# Patient Record
Sex: Female | Born: 1935 | Marital: Married
Health system: Southern US, Community
[De-identification: ages and names within clinical notes are randomized; demographics above are authoritative.]

---

## 2009-03-20 ENCOUNTER — Emergency Department: Payer: Self-pay | Admitting: Emergency Medicine

## 2011-03-25 ENCOUNTER — Ambulatory Visit: Payer: Self-pay

## 2011-09-18 ENCOUNTER — Ambulatory Visit: Payer: Self-pay

## 2011-10-13 ENCOUNTER — Ambulatory Visit: Payer: Self-pay | Admitting: Physician Assistant

## 2013-03-24 ENCOUNTER — Ambulatory Visit: Payer: Self-pay | Admitting: Ophthalmology

## 2013-03-24 LAB — CREATININE, SERUM
Creatinine: 0.95 mg/dL (ref 0.60–1.30)
EGFR (Non-African Amer.): 55 — ABNORMAL LOW

## 2013-08-14 ENCOUNTER — Ambulatory Visit: Payer: Self-pay | Admitting: Internal Medicine

## 2013-08-28 ENCOUNTER — Inpatient Hospital Stay: Payer: Self-pay | Admitting: Internal Medicine

## 2013-08-28 LAB — TROPONIN I: Troponin-I: <0.02 ng/mL

## 2013-08-28 LAB — COMPREHENSIVE METABOLIC PANEL
Alkaline Phosphatase: 134 U/L (ref 50–136)
Anion Gap: 7 (ref 7–16)
BUN: 16 mg/dL (ref 7–18)
Bilirubin,Total: 0.4 mg/dL (ref 0.2–1.0)
Creatinine: 0.59 mg/dL — ABNORMAL LOW (ref 0.60–1.30)
EGFR (African American): >60
EGFR (Non-African Amer.): >60
Potassium: 3.8 mmol/L (ref 3.5–5.1)
SGPT (ALT): 15 U/L (ref 12–78)
Sodium: 133 mmol/L — ABNORMAL LOW (ref 136–145)

## 2013-08-28 LAB — CBC WITH DIFFERENTIAL/PLATELET
Eosinophil #: 0.1 10*3/uL (ref 0.0–0.7)
Eosinophil %: 2 %
Lymphocyte #: 1.6 10*3/uL (ref 1.0–3.6)
Lymphocyte %: 20.9 %
MCHC: 33.6 g/dL (ref 32.0–36.0)
MCV: 80 fL (ref 80–100)
Monocyte #: 0.9 x10 3/mm (ref 0.2–0.9)
Monocyte %: 11.4 %
Neutrophil #: 4.9 10*3/uL (ref 1.4–6.5)
Platelet: 345 10*3/uL (ref 150–440)
RDW: 16.8 % — ABNORMAL HIGH (ref 11.5–14.5)
WBC: 7.5 10*3/uL (ref 3.6–11.0)

## 2013-08-28 LAB — URINALYSIS, COMPLETE
Bacteria: NONE SEEN
Blood: NEGATIVE
Nitrite: NEGATIVE
Ph: 6 (ref 4.5–8.0)
RBC,UR: 7 /HPF (ref 0–5)

## 2013-08-29 DIAGNOSIS — I359 Nonrheumatic aortic valve disorder, unspecified: Secondary | ICD-10-CM

## 2013-08-29 LAB — CBC WITH DIFFERENTIAL/PLATELET
Basophil #: 0.1 10*3/uL (ref 0.0–0.1)
Eosinophil #: 0.1 10*3/uL (ref 0.0–0.7)
Eosinophil %: 0.8 %
HCT: 37.1 % (ref 35.0–47.0)
HGB: 12.5 g/dL (ref 12.0–16.0)
Lymphocyte #: 1 10*3/uL (ref 1.0–3.6)
Lymphocyte %: 13.1 %
MCH: 26.7 pg (ref 26.0–34.0)
MCHC: 33.6 g/dL (ref 32.0–36.0)
Monocyte %: 12.1 %
Neutrophil #: 5.5 10*3/uL (ref 1.4–6.5)
Neutrophil %: 73.2 %
Platelet: 319 10*3/uL (ref 150–440)
RBC: 4.68 10*6/uL (ref 3.80–5.20)
RDW: 16.6 % — ABNORMAL HIGH (ref 11.5–14.5)

## 2013-08-29 LAB — BASIC METABOLIC PANEL
Anion Gap: 9 (ref 7–16)
BUN: 17 mg/dL (ref 7–18)
Calcium, Total: 8.7 mg/dL (ref 8.5–10.1)
Chloride: 103 mmol/L (ref 98–107)
Creatinine: 0.57 mg/dL — ABNORMAL LOW (ref 0.60–1.30)
EGFR (African American): >60
EGFR (Non-African Amer.): >60
Osmolality: 268 (ref 275–301)
Potassium: 3.7 mmol/L (ref 3.5–5.1)

## 2013-08-30 LAB — LIPID PANEL
HDL Cholesterol: 40 mg/dL (ref 40–60)
Triglycerides: 112 mg/dL (ref 0–200)

## 2013-09-01 LAB — URINALYSIS, COMPLETE
Blood: NEGATIVE
Leukocyte Esterase: NEGATIVE
Nitrite: NEGATIVE
Ph: 5 (ref 4.5–8.0)
RBC,UR: 7 /HPF (ref 0–5)
Specific Gravity: 1.016 (ref 1.003–1.030)
Squamous Epithelial: <1
WBC UR: 4 /HPF (ref 0–5)

## 2013-09-01 LAB — CBC WITH DIFFERENTIAL/PLATELET
Basophil #: 0.1 10*3/uL (ref 0.0–0.1)
Eosinophil #: 0 10*3/uL (ref 0.0–0.7)
Lymphocyte %: 1.4 %
MCH: 26.8 pg (ref 26.0–34.0)
MCHC: 33.3 g/dL (ref 32.0–36.0)
MCV: 80 fL (ref 80–100)
Monocyte #: 1.4 x10 3/mm — ABNORMAL HIGH (ref 0.2–0.9)
Monocyte %: 4.7 %
Neutrophil %: 93.7 %
Platelet: 375 10*3/uL (ref 150–440)
WBC: 30.3 10*3/uL — ABNORMAL HIGH (ref 3.6–11.0)

## 2013-09-01 LAB — BASIC METABOLIC PANEL
BUN: 44 mg/dL — ABNORMAL HIGH (ref 7–18)
Co2: 18 mmol/L — ABNORMAL LOW (ref 21–32)
Creatinine: 1.64 mg/dL — ABNORMAL HIGH (ref 0.60–1.30)
EGFR (African American): 33 — ABNORMAL LOW
EGFR (Non-African Amer.): 28 — ABNORMAL LOW
Glucose: 180 mg/dL — ABNORMAL HIGH (ref 65–99)
Osmolality: 275 (ref 275–301)
Potassium: 4.2 mmol/L (ref 3.5–5.1)
Sodium: 129 mmol/L — ABNORMAL LOW (ref 136–145)

## 2013-09-02 LAB — CBC WITH DIFFERENTIAL/PLATELET
Basophil %: 0.2 %
HCT: 39.2 % (ref 35.0–47.0)
HGB: 13 g/dL (ref 12.0–16.0)
Lymphocyte #: 0.4 10*3/uL — ABNORMAL LOW (ref 1.0–3.6)
MCH: 26.4 pg (ref 26.0–34.0)
MCV: 80 fL (ref 80–100)
Monocyte #: 2.1 x10 3/mm — ABNORMAL HIGH (ref 0.2–0.9)
Neutrophil #: 23.8 10*3/uL — ABNORMAL HIGH (ref 1.4–6.5)
Neutrophil %: 90.3 %
RBC: 4.91 10*6/uL (ref 3.80–5.20)
RDW: 17.1 % — ABNORMAL HIGH (ref 11.5–14.5)
WBC: 26.4 10*3/uL — ABNORMAL HIGH (ref 3.6–11.0)

## 2013-09-02 LAB — BASIC METABOLIC PANEL
Anion Gap: 8 (ref 7–16)
BUN: 58 mg/dL — ABNORMAL HIGH (ref 7–18)
Chloride: 102 mmol/L (ref 98–107)
Creatinine: 1.51 mg/dL — ABNORMAL HIGH (ref 0.60–1.30)
EGFR (African American): 36 — ABNORMAL LOW
EGFR (Non-African Amer.): 31 — ABNORMAL LOW
Glucose: 89 mg/dL (ref 65–99)
Osmolality: 282 (ref 275–301)
Potassium: 4.1 mmol/L (ref 3.5–5.1)

## 2013-09-03 LAB — BASIC METABOLIC PANEL
BUN: 49 mg/dL — ABNORMAL HIGH (ref 7–18)
Chloride: 106 mmol/L (ref 98–107)
Co2: 21 mmol/L (ref 21–32)
Creatinine: 0.94 mg/dL (ref 0.60–1.30)
EGFR (African American): >60
Osmolality: 289 (ref 275–301)
Potassium: 3.3 mmol/L — ABNORMAL LOW (ref 3.5–5.1)

## 2013-09-03 LAB — CBC WITH DIFFERENTIAL/PLATELET
Basophil %: 0.1 %
Eosinophil #: 0 10*3/uL (ref 0.0–0.7)
Eosinophil %: 0.1 %
HCT: 40.2 % (ref 35.0–47.0)
HGB: 13.1 g/dL (ref 12.0–16.0)
Lymphocyte #: 0.4 10*3/uL — ABNORMAL LOW (ref 1.0–3.6)
MCH: 26.2 pg (ref 26.0–34.0)
MCHC: 32.6 g/dL (ref 32.0–36.0)
Monocyte #: 1.4 x10 3/mm — ABNORMAL HIGH (ref 0.2–0.9)
Monocyte %: 6.1 %
Neutrophil %: 92.1 %
RBC: 5 10*6/uL (ref 3.80–5.20)

## 2013-09-03 LAB — URINE CULTURE

## 2013-09-04 LAB — BASIC METABOLIC PANEL
Anion Gap: 10 (ref 7–16)
BUN: 33 mg/dL — ABNORMAL HIGH (ref 7–18)
Calcium, Total: 7.8 mg/dL — ABNORMAL LOW (ref 8.5–10.1)
Chloride: 109 mmol/L — ABNORMAL HIGH (ref 98–107)
Co2: 23 mmol/L (ref 21–32)
EGFR (African American): >60
EGFR (Non-African Amer.): >60
Glucose: 109 mg/dL — ABNORMAL HIGH (ref 65–99)
Potassium: 2.3 mmol/L — CL (ref 3.5–5.1)

## 2013-09-04 LAB — ALBUMIN: Albumin: 2.3 g/dL — ABNORMAL LOW (ref 3.4–5.0)

## 2013-09-04 LAB — PHOSPHORUS
Phosphorus: 0.9 mg/dL — CL (ref 2.5–4.9)
Phosphorus: 1.6 mg/dL — ABNORMAL LOW (ref 2.5–4.9)

## 2013-09-05 LAB — SODIUM: Sodium: 137 mmol/L (ref 136–145)

## 2013-09-06 LAB — WBC: WBC: 15.1 10*3/uL — ABNORMAL HIGH (ref 3.6–11.0)

## 2013-09-06 LAB — CALCIUM: Calcium, Total: 7.3 mg/dL — ABNORMAL LOW (ref 8.5–10.1)

## 2013-09-06 LAB — MAGNESIUM: Magnesium: 1.8 mg/dL

## 2013-09-06 LAB — SODIUM: Sodium: 138 mmol/L (ref 136–145)

## 2013-09-06 LAB — POTASSIUM: Potassium: 3 mmol/L — ABNORMAL LOW (ref 3.5–5.1)

## 2013-09-13 ENCOUNTER — Ambulatory Visit: Payer: Self-pay | Admitting: Internal Medicine

## 2013-09-13 DEATH — deceased

## 2015-04-05 NOTE — Consult Note (Signed)
   Comments   I met with pt's daughter, Erline Levine. I told her of my conversations with both of pt's sons. Erline Levine says that all 4 of pt's children are in agreement with pt going to the [facility]. Ginny Ward, RN, liason for the [facility] notified and will see pt.  Dx: CVA    Secondary Dx: dysphagia, COPD, pulmonary fibrosis  Electronic Signatures: Meisha Salone, Izora Gala (MD)  (Signed 23-Sep-14 13:55)  Authored: Palliative Care   Last Updated: 23-Sep-14 13:55 by Meshawn Oconnor, Izora Gala (MD)

## 2015-04-05 NOTE — Consult Note (Signed)
   Comments   Josh Borders, NP, and I met with pt's 2 daughters. Updated them on pt's current clinical status including results of speech evaluation. We discussed the options of continued aggressive medical tx including feeding tube vs less aggressive tx with pleasure feeding. Daughters do not think pt would choose feeding tube. They ask that we speak with pt's 2 sons for their input. Will follow up with daughters after discussion with sons.   Electronic Signatures: Hridhaan Yohn, Izora Gala (MD)  (Signed 22-Sep-14 20:28)  Authored: Palliative Care   Last Updated: 22-Sep-14 20:28 by Lida Berkery, Izora Gala (MD)

## 2015-04-05 NOTE — H&P (Signed)
PATIENT NAME:  Octavio MannsCAIN, Rochel M MR#:  [id number] DATE OF BIRTH:  12-19-1925  DATE OF ADMISSION:  08/28/2013  PRIMARY CARE PHYSICIAN: [facility].   CHIEF COMPLAINT: Slurred speech and weakness.   HISTORY OF PRESENT ILLNESS: This is an 81 year old female who presents to the hospital, brought in by her daughter due to slurred speech and weakness. As per the daughter, the patient when she woke up this morning noticed that her mother had slurred speech. She also noticed that she was unable to get out of her bed and was having significant weakness and therefore was brought to the ER. In the Emergency Room, the patient continued to have generalized weakness. She underwent a CT of the head, which was negative. She still continues to have some mild slurred speech but otherwise feels well. She denies any headache. She denies any nausea and vomiting. She denies any blurry vision. She denies any numbness or tingling, any seizure-type activity. The patient was also incidentally noted to have a left-sided pleural effusion, but does not complaining of shortness of breath, chest pain, palpitations, or any other associated symptoms presently. The patient is suspicious to having a possible underlying cerebrovascular accident. Therefore, hospitalist services were contacted for further treatment and evaluation.   REVIEW OF SYSTEMS: CONSTITUTIONAL: No documented fever. No weight gain or weight loss.  EYES: No blurred or double vision.  ENT: No tinnitus. No postnasal drip. No redness of the oropharynx.  RESPIRATORY: No cough, no wheeze, no hemoptysis, no dyspnea. Positive COPD. CARDIOVASCULAR: No chest pain, no orthopnea, no palpitations, no syncope.  GASTROINTESTINAL: No nausea. No vomiting. No diarrhea. No abdominal pain. No melena or hematochezia.  GENITOURINARY: No dysuria or hematuria.  ENDOCRINE: No polyuria or nocturia. No heat or cold intolerance.  HEMATOLOGIC: No anemia, no bruising, no bleeding.   INTEGUMENTARY: No rashes. No lesions.  MUSCULOSKELETAL: No arthritis, no swelling, no gout.  NEUROLOGIC: No numbness. No tingling. No ataxia. Positive right-sided weakness and positive slurred speech.   PSYCHIATRIC: No anxiety. No insomnia. No ADD.   PAST MEDICAL HISTORY: Consistent with osteoarthritis, hypertension, and long history of tobacco abuse, history of right-sided visual loss secondary to suspected brain malignancy.   ALLERGIES: No known drug allergies.   SOCIAL HISTORY: Does smoke about 10 cigarettes per day, has been smoking for the past 50 to 60 years. No alcohol abuse. No illicit drug abuse. Lives at home with her daughter.   FAMILY HISTORY: Both mother and father are deceased. Mother died from old age. Father died from complications of heart disease.   CURRENT MEDICATIONS: As follows: Aspirin 81 mg daily, Celebrex 200 mg 1 to 2 caps b.i.d. as needed, hydrochlorothiazide 25 mg daily, lisinopril 40 mg daily, verapamil 240 mg daily. The patient apparently is noncompliant with her meds.   PHYSICAL EXAMINATION: On admission:  VITAL SIGNS: Temperature is 97.7, pulse 90, respirations 16, blood pressure 170/86, sats 98% on room air.  GENERAL: She is a pleasant-appearing female, lethargic, but in no apparent distress.  HEAD, EYES, EARS, NOSE, THROAT EXAM: The patient is atraumatic, normocephalic. Extraocular muscles are intact. Pupils are equal and reactive eye to light. Sclerae anicteric. No conjunctival injection. No pharyngeal erythema.  NECK: Supple. There is no jugular venous distention. No bruits, no lymphadenopathy or thyromegaly.  HEART: Regular rate and rhythm. She does have a 2 to 3 out of 6 systolic ejection murmur heard at the right sternal border. No rubs. No clicks.  LUNGS: Clear to auscultation bilaterally. No rales or rhonchi.  No wheezes. She does have prolonged inspiratory and expiratory phase consistent with COPD.  ABDOMEN: Soft, flat, nontender, nondistended. Has  good bowel sounds. No hepatosplenomegaly appreciated.  EXTREMITIES: No evidence of any cyanosis, clubbing, or peripheral edema. Has +2 pedal and radial pulses bilaterally.  NEUROLOGICAL: The patient is alert, awake, and oriented x3. She does have about a 2/5 strength in the right lower extremity. Reflexes are +2 bilaterally. No other motor deficits appreciated. No other sensory deficits appreciated. She has a slight right-sided facial droop. Babinski's are downgoing bilaterally. No clonus is appreciated.   SKIN: Moist and warm with no rashes.  LYMPHATIC: There is no cervical or axillary lymphadenopathy.   LABORATORY DATA: Showed a serum glucose of 93, BUN 16, creatinine 0.5, sodium 133, potassium 3.8, chloride 103, bicarbonate 23. The patient's LFTs were within normal limits. Troponin less than 0.03. White cell count is 7.5, hemoglobin 13.5, hematocrit 40.2, platelet count 345. The patient did have a chest x-ray done which showed a new left-sided pleural effusion. Findings reflective of COPD. The patient also had a CT of the head done, which showed no evidence of any acute intracranial abnormality, but chronic microvascular ischemic disease.   ASSESSMENT AND PLAN: This 81 year old female with a history of marked hypertension, arthritis, long history of tobacco abuse, history of suspected brain malignancy who presents to the hospital due to slurred speech and also right-sided weakness.  1. Acute cerebrovascular accident. This is likely the suspected diagnosis given her sudden onset of slurred speech and right-sided weakness. Initial CT head is negative. I will go ahead and get an MRI of her brain, get a carotid duplex and a 2-dimensional echocardiogram. She is not compliant with her aspirin, therefore, we will continue that for now,  start her on a statin. I will get a physical therapy consult. I will tolerate some element of hypertension given the fact of her acute cerebrovascular accident.  Goal systolic  blood pressure between 160 to 170 and diastolics of 80s to 90s.  2. Hypertension. Her blood pressure is currently slightly elevated but I will tolerate that given the suspicious for a new stroke. Continue her verapamil, hydrochlorothiazide and lisinopril as the patient is apparently noncompliant with her medications.  3. Chronic obstructive pulmonary disease. There is no evidence of acute chronic obstructive pulmonary disease exacerbation. I will continue p.r.n. DuoNebs. Assess her for home oxygen prior to discharge.  4. Tobacco abuse. Placed her on a nicotine patch.  5. Urinary tract infection based on her urinalysis. I will start her on p.o. Cipro for five days.  6.  Left-sided pleural effusion. The patient does have a long history of tobacco abuse, high risk for having underlying malignancy. I will get a CT chest without contrast to further evaluate this. The patient is not hypoxic or complaining of an acute respiratory symptoms presently.   CODE STATUS: The patient is a FULL CODE.   TIME SPENT: 50 minutes.    ____________________________ Rolly Pancake. Cherlynn Kaiser, MD vjs:sg D: 08/28/2013 09:43:51 ET T: 08/28/2013 10:35:03 ET JOB#: [id number]  cc: Rolly Pancake. Cherlynn Kaiser, MD, <Dictator> Houston Siren MD ELECTRONICALLY SIGNED 08/31/2013 17:27

## 2015-04-05 NOTE — Discharge Summary (Signed)
PATIENT NAME:  Holly Clayton, Holly Clayton MR#:  [id number] DATE OF BIRTH:  1926/10/15  DATE OF ADMISSION:  08/28/2013 DATE OF DISCHARGE:  08/31/2013  PRIMARY CARE PHYSICIAN: [facility].   DISCHARGE DIAGNOSES: 1.  Acute cerebrovascular accident of corpus callosum and basal ganglia of the left side.  2.  Left-sided pleural effusion, improving.  3.  Hypertension.   CONSULTS:  1.  Dr. Meredeth Clayton of pulmonology. It was recommended the patient follow up in 1 to  2 weeks as an outpatient for the left pleural effusions.  2.  CT of the chest which showed mild lymphadenopathy and left pleural effusion. No mass found.  3.  MRI of the brain showed corpus callosum and basal ganglia acute stroke on the left, and pontine and cerebellar white matter ischemic changes.  4.  Echocardiogram showed normal EF. No embolus.  5.  Carotid Doppler showed no significant stenosis.   ADMITTING HISTORY AND PHYSICAL: Please see detailed H and P dictated previously. In brief, an 81 year old female patient with history of hypertension, presented to the hospital with slurred speech and weakness. On neurological examination, the patient had 2/5 strength in the right lower extremity, also right-sided facial droop and admitted to hospitalist service.   HOSPITAL COURSE: Acute CVA: The patient had an MRI of the brain done which showed corpus callosum and basal ganglia acute CVA on the left side. The patient had echo carotids Doppler which showed no significant defects that needed to be fixed. The patient was started on aspirin, statin, better blood pressure control. Blood pressure is in the normal range at this time. The patient worked with physical therapy. Rehab was recommended and the patient will be discharged to rehab for further physical therapy.   The patient also had a new diagnosis of left pleural effusion, but no lung mass. This is improving on repeat chest x-ray on day of discharge. She does not complain of any shortness of  breath, chest pain, not on oxygen, and as the patient is on aspirin a thoracentesis could not be done by interventional radiology and she will follow up with Dr. Meredeth Clayton in 1 to 2 weeks as recommended by him.   Today on exam, patient's cardiac exam shows a mild systolic murmur, weakness on the right lower extremity.   DISCHARGE MEDICATIONS: Include:  1.  Verapamil 240 mg oral daily.  2.  Lisinopril 40 mg oral daily.  3.  Celebrex 200 mg, 1 to 2 capsules orally 1 to 2 times a day as needed for pain.  4.  Hydrochlorothiazide 25 mg oral once a day.  5.  Atorvastatin 20 mg oral once a day.  6.  Aspirin 325 mg oral once a day.  7. Nicotine 14 mcg transdermal patch once a day.   DISCHARGE INSTRUCTIONS: Low-sodium, low-fat diet, mechanical soft diet with thin liquids.   Activity as tolerated with assistance.   Follow up with Dr. Meredeth Clayton regarding left pleural effusions in 1 to 2 weeks. May need thoracentesis.   Also follow up with rehab physician in a day.   Time spent on day of discharge in discharge activity was forty minutes.     ____________________________ Holly BailiffSrikar R. Akaash Vandewater, Holly Clayton srs:dm D: 08/31/2013 11:57:04 ET T: 08/31/2013 12:21:32 ET JOB#: [id number]  cc: Holly HeathSrikar R. Rishit Burkhalter, Holly Clayton, <Dictator> [facility] Holly  E. Meredeth IdeFleming, Holly Clayton Holly Clayton ELECTRONICALLY SIGNED 09/04/2013 10:44

## 2015-04-05 NOTE — Discharge Summary (Signed)
PATIENT NAME:  Holly Clayton, Holly M MR#:  [id number] DATE OF BIRTH:  [birth year]  DATE OF ADMISSION:  08/28/2013 DATE OF DISCHARGE:  09/06/2013  PRIMARY CARE PHYSICIAN: [facility].   FINAL DIAGNOSES: 1.  Acute and evolving left cerebrovascular accident with right arm and leg weakness. 2.  Acute renal failure from hypotension and dehydration.  3.  Hypertension.  4.  Hypokalemia, hypophosphatemia.  5.  Hypomagnesemia.  6.  Chronic obstructive pulmonary disease.  7.  Tobacco abuse.  8.  Urinary tract infection and leukocytosis.  9.  Left-sided pleural effusion.  10.  Malnutrition and failure to thrive.   MEDICATIONS: On discharge to the hospice home include morphine 20 mg/mL 0.25 mL every hour as needed for shortness of breath, lorazepam 1 to 2 tablets orally sublingually every 2 to 4 hours as needed for anxiety, agitation.   REASON FOR ADMISSION: The patient was admitted 08/28/2013. Came in with slurred speech and weakness. Admitted with acute CVA, also a urinary tract infection and was started on Cipro.   LABORATORY AND RADIOLOGICAL DATA DURING HOSPITAL COURSE: Initially, urinalysis with 3+ leukocyte esterase. TSH 4.43. Troponin negative. Glucose 93, BUN 16, creatinine 0.59, sodium 133, potassium 3.8, chloride 103, CO2 of 23, calcium 9.2. Liver function tests: Normal range. White blood cell count 7.5, hemoglobin and hematocrit 13.5 and 40.2, platelet count of 345. EKG: Sinus rhythm, PVCs. Chest x-ray: New left pleural effusion, underlying COPD. CAT scan of the head showed atrophy with chronic microvascular ischemic disease. CT scan of the head showed moderately large left pleural effusion, mediastinal adenopathy. Density dependent portion of the distal trachea may represent adherent secretions, compression atelectasis and/or fibrosis. Ultrasound of the carotids showed considerable calcified plaque right bulb, small amount elsewhere. Echo showed no source of CVA noted, left ventricular  hypertrophy, EF 55% to 60%. MRI of the brain showed evidence of acute ischemic change in the mid body of the corpus callosum and the adjacent basal ganglia on the left, ventriculomegaly. Repeat CT scan of the head without contrast on September 18 showed changes of atrophy, chronic microvascular ischemic disease, evolving infarct adjacent of the body of the lateral ventricle, no hemorrhage. Urine culture: No growth coming back on the 19th. White count repeated on the 20th was 26.4. Phosphorus on the day of discharge 1.2, magnesium 1.8, calcium 7.3, glucose 120, sodium 138. White blood cell count 15.1.   HOSPITAL COURSE PER PROBLEM LIST:  1.  For the patient's acute CVA, initially the patient was doing okay and then was going to be sent out of the hospital on the 18th, but then she had another episode on the 18th and had progression of her stroke on the 18th, and then after that she had declined. She had failed the swallow evaluation and the modified barium swallow. Her right side was very weak. She was only able to move her fingers and her toes, but unable to lift her right arm and right leg. Palliative care consultation was done. Family decided to go with hospice home care and comfort care measures secondary to poor quality of life. The patient will not be able to keep up with her nutritional needs. During the hospital course, she was on IV fluids for acute renal failure and later need to be on TPN secondary to unable to swallow. 2.  For her history of hypertension, she was on blood pressure medications but with the evolution of the stroke, her blood pressure dropped and no need for blood pressure medications during the  rest of the hospital stay.  3.  Because she failed her swallow evaluation, I was replacing electrolytes including potassium, phosphate and magnesium during the hospitalization. 4.  For her COPD, there was no acute exacerbation during the hospital course. 5.  For her tobacco abuse, we did have  a nicotine patch on her. 6.  She was treated for a UTI during the hospital course. 7.  Leukocytosis was probably secondary to the evolving stroke.  8.  For the left-sided pleural effusion, nothing to do there. 9.  For her severe ill malnutrition and failure to thrive, she failed the swallow eval and unable to keep up with the nutritional needs. The patient was sent to the hospice home.   TIME SPENT ON DISCHARGE: 35 minutes.   ____________________________ Herschell Dimes. Renae Gloss, MD rjw:jm D: 09/06/2013 14:55:58 ET T: 09/06/2013 15:14:59 ET JOB#: [id number]  cc: Herschell Dimes. Renae Gloss, MD, <Dictator> [facility] Salley Scarlet MD ELECTRONICALLY SIGNED 09-22-13 14:46

## 2015-04-05 NOTE — Consult Note (Signed)
Referring Physician:  Henreitta Leber :   Primary Care Physician:  Bufford Spikes FNP, CRISSMAN MAR :   Reason for Consult: Admit Date: 28-Aug-2013  Chief Complaint: Slurred speech and weakness.  Reason for Consult: weakness and slurred speech   History of Present Illness: History of Present Illness:   81 year old woman with a complex medical history who initially came in a week ago with dysarthria and generalized sided weakness.  The patient had the symptoms in the morning upon waking up which her daughter noticed.  The patient was having trouble getting out of bed.  After getting dressed, the patient was taken to the ED where she was seen to have a generalized sense of weakness.  Her HCT was negative.  Ultimately, the patient was found to have had an acute stroke on MRI and had a full workup with carotid dopplers and TTE which were unremarkable.  She was started on ASA and statin and prepared for discharge when she developed significant worsening of right sided weakness.  Her right arm and leg became acutely paralyzed without any strength.  There was right facial droop and significant worsening of her dysarthria.  Repeat scan was performed showing progression/extension of her initial stroke.  Neurology is asked to evaluate. 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  HISTORY: Both mother and father are deceased. Mother died from old age. Father died from complications of heart disease.  HISTORY: Does smoke about 10 cigarettes per day, has been smoking for the past 50 to 60 years. No alcohol abuse. No illicit drug abuse. Lives at home with her daughter.  MEDICATIONS: 81 mg daily200 mg 1 to 2 caps b.i.d. as needed25 mg daily40 mg daily240 mg daily. patient apparently is noncompliant with her meds.   ALLERGIES:known drug allergies.   STATUS: The patient is a FULL  CODE.    ROS:  General denies complaints   HEENT no complaints   Lungs no complaints   Cardiac no complaints   GI no complaints   GU no complaints   Musculoskeletal no complaints   Extremities no complaints   Skin no complaints   Endocrine no complaints   Psych no complaints   Past Medical/Surgical Hx:  Stroke:   HLD:   HTN:   Home Medications: Medication Instructions Last Modified Date/Time  atorvastatin 20 mg oral tablet 1 tab(s) orally once a day 18-Sep-14 10:56  aspirin 325 mg oral delayed release tablet 1 tab(s) orally once a day 18-Sep-14 10:56  nicotine 14 mg/24 hr transdermal film, extended release 1 patch transdermal once a day 18-Sep-14 10:56  verapamil 240 mg/24 hours oral capsule, extended release 1 cap(s) orally once a day 15-Sep-14 07:42  lisinopril 40 mg oral tablet 1 tab(s) orally once a day 15-Sep-14 07:42  CeleBREX 200 mg oral capsule 1-2 cap(s) orally 1 to 2 times a day, As Needed - for Pain 15-Sep-14 07:42  hydrochlorothiazide 25 mg oral tablet 1 tab(s) orally once a day 15-Sep-14 07:42   [facility] Current Meds:  Sodium Chloride 0.9%, 1000 ml at 100 ml/hr  Acetaminophen * tablet, ( Tylenol (325 mg) tablet)  650 mg Oral q4h PRN for pain or temp. greater than 100.4  - Indication: Pain/Fever  Ondansetron injection, ( Zofran injection )  4 mg, IV push, q4h PRN for Nausea/Vomiting  Indication: Nausea/ Vomiting  Aspirin Enteric Coated tablet, ( Ecotrin)  325 mg Oral  daily  - Indication: Pain/Fever/Thromboembolic Disorders/Post MI/Prophylaxis MI  Instructions:  Initiate Bleeding Precautions Protocol--DO NOT CRUSH  atorvaSTATin tablet,  ( Lipitor)  20 mg Oral daily  - Indication: Hypercholesterolemia  Nicotine Patch, ( Habitrol patch )  14 mg Transdermal daily  -Indication:Smoking Cessation  Instructions:  [Waste Code: Black with pkg]  Nursing Saline Flush, 3 to 6 ml, IV push, Q1M PRN for IV Maintenance  Ciprofloxacin injection, ( Cipro  injection )  400 mg, IV Piggyback, q12h, Infuse over 60 minute(s)  Indication: Infection  Allergies:  No Known Allergies:   Vital Signs: **Vital Signs.:   19-Sep-14 03:46  Vital Signs Type Routine  Temperature Temperature (F) 98.3  Celsius 36.8  Temperature Source oral  Pulse Pulse 94  Respirations Respirations 20  Systolic BP Systolic BP 299  Diastolic BP (mmHg) Diastolic BP (mmHg) 73  Mean BP 91  Pulse Ox % Pulse Ox % 99  Pulse Ox Activity Level  At rest  Oxygen Delivery Room Air/ 21 %   EXAM: PHYSICAL EXAMINATION  GENERAL: Pleasant.  NAD.  Barrel chest.  Normocephalic and atraumatic.  EYES: Funduscopic exam shows normal disc size, appearance and C/D ratio without clear evidence of papilledema.  CARDIOVASCULAR: S1 and S2 sounds are within normal limits, without murmurs, gallops, or rubs.  MUSCULOSKELETAL: Bulk - Normal Tone - Normal Pronator Drift - Absent bilaterally. Ambulation - Unable to ambulate due to RLE weakness.  R/L 0/5    Shoulder abduction (deltoid/supraspinatus, axillary/suprascapular n, C5) 0/5    Elbow flexion (biceps brachii, musculoskeletal n, C5-6) 0/5    Elbow extension (triceps, radial n, C7) 0/5    Finger adduction (interossei, ulnar n, T1)   0/5    Hip flexion (iliopsoas, L1/L2) 0/5    Knee flexion (hamstrings, sciatic n, L5/S1) 0/5    Knee extension (quadriceps, femoral n, L3/4) 1/5    Ankle dorsiflexion (tibialis anterior, deep fibular n, L4/5) 1/5    Ankle plantarflexion (gastroc, tibial n, S1)  NEUROLOGICAL: MENTAL STATUS: Patient is oriented to person, place and time.  Recent and remote memory are intact.  Attention span and concentration are intact.  Naming, repetition, comprehension and expressive speech are within normal limits, ie no aphasia, however there is a moderate to severe dysarthria present.  Patient's fund of knowledge is within normal limits for educational level.  CRANIAL NERVES: Normal    CN II (normal visual  acuity and visual fields) Normal    CN III, IV, VI (extraocular muscles are intact) Normal    CN V (facial sensation is intact bilaterally) Abnormal    CN VII (facial droop in RLQ) Normal    CN VIII (hearing is intact bilaterally) Abnormal    CN IX/X (palate elevates midline but there is dysarthric speech) Normal    CN XI (shoulder shrug strength is normal and symmetric) Normal    CN XII (tongue protrudes midline)   SENSATION: Intact to pain and temp bilaterally (spinothalamic tracts) Intact to position and vibration bilaterally (dorsal columns)   REFLEXES: R/L 2+/2+    Biceps 2+/2+    Brachioradialis   2+/2+    Patellar 2+/2+    Achilles   COORDINATION/CEREBELLAR: Finger to nose testing is within normal limits.  Lab Results:  Thyroid:  15-Sep-14 07:19   Thyroid Stimulating Hormone 4.43 (0.45-4.50 (International Unit)  ----------------------- Pregnant patients have  different reference  ranges for TSH:  - - - - - - - - - -  Pregnant, first trimetser:  0.36 - 2.50 uIU/mL)  LabObservation:  15-Sep-14 13:08   OBSERVATION Reason for Test  Hepatic:  15-Sep-14 07:19   Bilirubin, Total 0.4  Alkaline Phosphatase 134  SGPT (ALT) 15  SGOT (AST) 23  Total Protein, Serum 7.5  Albumin, Serum  3.1  Routine Chem:  17-Sep-14 05:02   Cholesterol, Serum  321  Triglycerides, Serum 112  HDL (INHOUSE) 40  VLDL Cholesterol Calculated 22  LDL Cholesterol Calculated  259 (Result(s) reported on 30 Aug 2013 at 06:18AM.)  19-Sep-14 03:38   Glucose, Serum  180  BUN  44  Creatinine (comp)  1.64  Sodium, Serum  129  Potassium, Serum 4.2  Chloride, Serum 99  CO2, Serum  18  Calcium (Total), Serum 8.8  Anion Gap 12  Osmolality (calc) 275  eGFR (African American)  33  eGFR (Non-African American)  28 (eGFR values <28m/min/1.73 m2 may be an indication of chronic kidney disease (CKD). Calculated eGFR is useful in patients with stable renal function. The eGFR calculation will not be  reliable in acutely ill patients when serum creatinine is changing rapidly. It is not useful in  patients on dialysis. The eGFR calculation may not be applicable to patients at the low and high extremes of body sizes, pregnant women, and vegetarians.)  Cardiac:  15-Sep-14 07:19   Troponin I < 0.02 (0.00-0.05 0.05 ng/mL or less: NEGATIVE  Repeat testing in 3-6 hrs  if clinically indicated. >0.05 ng/mL: POTENTIAL  MYOCARDIAL INJURY. Repeat  testing in 3-6 hrs if  clinically indicated. NOTE: An increase or decrease  of 30% or more on serial  testing suggests a  clinically important change)  Routine UA:  15-Sep-14 07:19   Color (UA) Yellow  Clarity (UA) Clear  Glucose (UA) Negative  Bilirubin (UA) Negative  Ketones (UA) 1+  Specific Gravity (UA) 1.010  Blood (UA) Negative  pH (UA) 6.0  Protein (UA) Negative  Nitrite (UA) Negative  Leukocyte Esterase (UA) 3+ (Result(s) reported on 28 Aug 2013 at 09:07AM.)  RBC (UA) 7 /HPF  WBC (UA) 18 /HPF  Bacteria (UA) NONE SEEN  Epithelial Cells (UA) 1 /HPF (Result(s) reported on 28 Aug 2013 at 09:07AM.)  Routine Hem:  19-Sep-14 03:38   WBC (CBC)  30.3  RBC (CBC)  5.62  Hemoglobin (CBC) 15.0  Hematocrit (CBC) 45.2  Platelet Count (CBC) 375  MCV 80  MCH 26.8  MCHC 33.3  RDW  16.5  Neutrophil % 93.7  Lymphocyte % 1.4  Monocyte % 4.7  Eosinophil % 0.0  Basophil % 0.2  Neutrophil #  28.4  Lymphocyte #  0.4  Monocyte #  1.4  Eosinophil # 0.0  Basophil # 0.1 (Result(s) reported on 01 Sep 2013 at 05:09AM.)   Radiology Results: [place]    15-Sep-14 10:40, [place]  [place] Bilateral   REASON FOR EXAM:    slurred speech. Acute CVA.  COMMENTS:       PROCEDURE: [place] - [place] DOPPLER BILATERAL  - Aug 28 2013 10:40AM     RESULT: Grayscale and color flow Doppler techniques were employed to   evaluate the cervical carotid systems.    There is a large amount of calcified plaque on the right. Shadowing    plaque limits evaluation of the carotid bulb. On the left there is soft   and calcified plaque in the distal CCA and calcified plaque at the bulb   and proximal ICA. The waveform patterns and color flow images do not   suggest high-grade stenotic  lesions on either side. On the right the peak   internal carotid systolic velocity measured 90 cm/sec and the peak common   carotid velocity measured 77 centimeters second correspondingto ratio of     1.2. On the left the peak internal carotid systolic velocity measured 105   cm per second and the peak common carotid velocity measured 56 cm second   corresponding to a normal ratio of 1.9. The vertebral arteries are normal   in flowdirection bilaterally.    IMPRESSION:    1. There is considerable calcified plaque at the right carotid bulb with   smaller amounts elsewhere on the right and on the left. There is no   definite evidence of stenosis greater than 50% within either carotid   system. Further evaluation with MR angiography may be useful if the   patient can tolerate the procedure.  2. The vertebral arteries are normal in flow direction bilaterally.     Dictation Site: 2    Verified By: DAVID A. Martinique, M.D., MD  MRI:    15-Sep-14 16:44, MRI Brain Without Contrast  MRI Brain Without Contrast   REASON FOR EXAM:    Acute CVA. slurred speech.  COMMENTS:       PROCEDURE: MR  - MR BRAIN WO CONTRAST  - Aug 28 2013  4:44PM     RESULT: Multiplanar images were obtained through the brain without   administration of gadolinium.    The diffusion-weighted sequences reveal an area of abnormal signal in the   basal ganglia and adjacent pericallosal parenchyma on the left consistent   with acute ischemia. No other areas of acute ischemic signal change are   demonstrated. The inversion recovery-FLAIR sequences reveal moderate   amounts of increased periventricular and more peripheral increased white   matter signal in both cerebral hemispheres.  Punctate areas of increased   signal are noted in the pons. The cerebellar hemispheres appear spared.     The T1 and T2 weighted images reveal mild diffuse cerebral and cerebellar   atrophy with compensatory ventriculomegaly. There is no evidence of acute   intracranial blood. There is no shift of the midline. No abnormal   intranor extra-axial fluid collections are demonstrated. The observed   portions of the paranasal sinuses and mastoid air cells are clear.    IMPRESSION:   1. There is evidence of acute ischemic change in the mid body of the   corpus callosum and in the adjacent basal ganglia on the left.  2. There are age related atrophic changes usually with mild compensatory   ventriculomegaly.  3. There are white matter signal changes in both cerebral hemispheres as   well as in the pons consistent with the sequelae of chronic small vessel  ischemia.   Dictation Site: 2        Verified By: DAVID A. [place]ique, M.D., MD  CT:    15-Sep-14 07:40, CT Head Without Contrast  CT Head Without Contrast   REASON FOR EXAM:    weak, slurry speech, leaning to 1 side  COMMENTS:       PROCEDURE: CT  - CT HEAD WITHOUT CONTRAST  - Aug 28 2013  7:40AM     RESULT: Noncontrast emergent CT of the brain shows prominence of the   ventricles and sulci. There is low-attenuation diffusely in the   periventricular and subcortical white matter most consistent with chronic   microvascular ischemic disease. There is no evidence of intracranial   hemorrhage, mass, mass  effect or midline shift. No evolving infarct is   appreciated. The included sinuses and mastoid air cells demonstrate   normal appearing aeration. No depressed skull fracture is demonstrated.    IMPRESSION:   1. Changes of atrophy with chronic microvascular ischemic disease. No     acute intracranial abnormality.    Dictation Site: 1        Verified By: Sundra Aland, M.D., MD    18-Sep-14 15:04, CT Head Without Contrast  CT  Head Without Contrast   REASON FOR EXAM:    acute encephalopathy. Recent CVA.  COMMENTS:       PROCEDURE: CT  - CT HEAD WITHOUT CONTRAST  - Aug 31 2013  3:04PM     RESULT: Emergent noncontrast CT is compared to the previous exam dated   08/28/2013. There is again prominence of the ventricles and sulci. There   is low-attenuation diffusely in the periventricular and subcortical white   matter most consistent with chronic microvascular ischemic disease. There   is some low-attenuation adjacent to the body of the left lateral   ventricle on image 17 which was not definitely demonstrated previously.   This may represent an evolving infarct. There is no intracranial   hemorrhage, mass, mass effect or other evolving infarct. The paranasal   sinuses and mastoid air cells show normal appearing aeration. No   depressed skull fracture is evident.  IMPRESSION:   1. Changes of atrophy. Chronic microvascular ischemic disease is present.   There appears to be an evolving infarct adjacent to the body of the left   lateral ventricle best appreciated on image 17 and measuring   approximately 1.9 cm anterior to posterior and 8 mm transversely on image   17. No associated hemorrhage, mass effect or midline shift.    Dictation Site: 1        Verified By: Sundra Aland, M.D., MD   Impression/Recommendations: Recommendations:   81 year old woman with a complex medical history who initially came in a week ago with dysarthria and generalized sided weakness. with slightly improved strength today in the RLE, able to wiggle her toes, unable to do this yesterday.  Still no strength in RUE.  Moderate and severe aphasia is stable since yesterday.  As before, appears to have R facial droop and imaging shows extension of recently ischemic stroke without hemorrhagic conversion.  Agree with continuing ASA 81 mg daily plus statin.  No need to repeat stroke workup.  At this point given concern for new stroke please  allow for permissive hypertension up to the possibility of 220/110.  Maintain adequate hydration and do not let her SBP fall below 100.  Rec agressive PT, OT and ST.  Advised no smoking.  Please give lovenox for DVT prevention.     have reviewed the results of the most recent imaging studies, tests and labs as outlined above and answered all related questions.   Electronic Signatures: Anabel Bene (MD)  (Signed 20-Sep-14 02:19)  Authored: REFERRING PHYSICIAN, Primary Care Physician, Consult, History of Present Illness, Review of Systems, PAST MEDICAL/SURGICAL HISTORY, HOME MEDICATIONS, Current Medications, ALLERGIES, NURSING VITAL SIGNS, Physical Exam-, LAB RESULTS, RADIOLOGY RESULTS, Recommendations   Last Updated: 20-Sep-14 02:19 by Anabel Bene (MD)

## 2015-07-08 IMAGING — CT CT HEAD WITHOUT CONTRAST
1 of 2 series · 13 of 30 positions shown, 17 images · non-contrast
Comparison: none

REASON FOR EXAM: acute encephalopathy. Recent CVA.
COMMENTS:

[Series 4: soft tissue recon · axial · 0.41mm/px · z∈[+363,+493]mm · 13 of 32 slices shown, 17 images]
[im 3/32  brain]
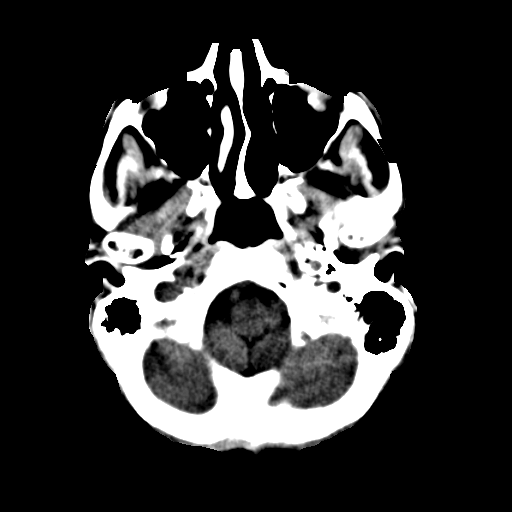
[im 3/32  bone]
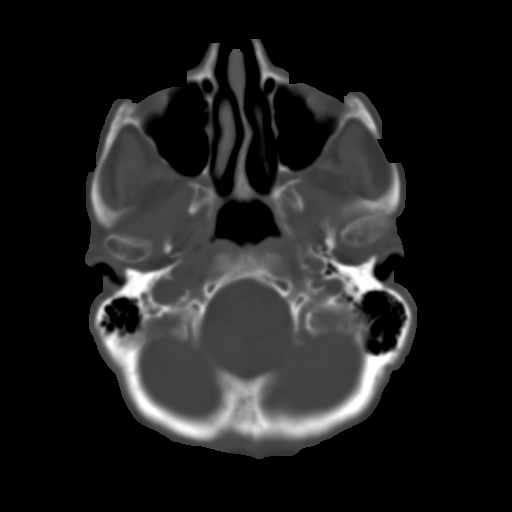
[im 5/32  brain]
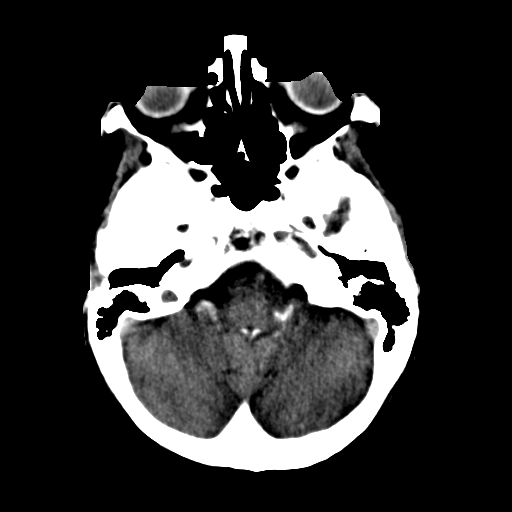
[im 7/32  brain]
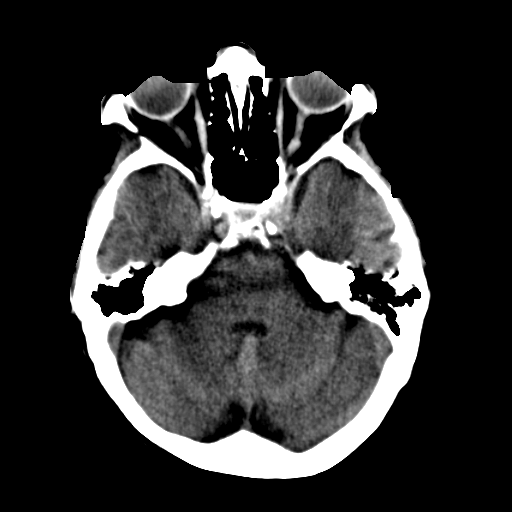
[im 9/32  brain]
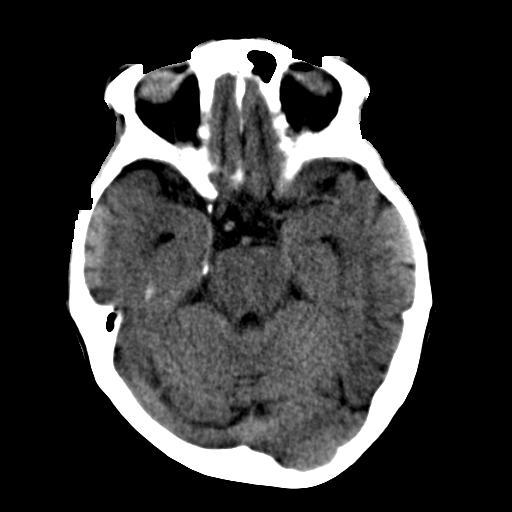
[im 12/32  brain]
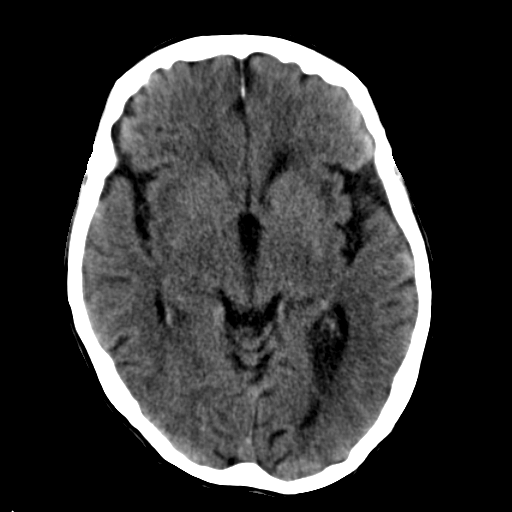
[im 12/32  bone]
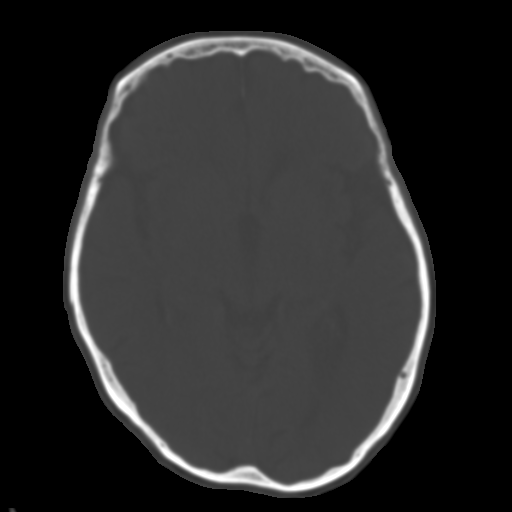
[im 14/32  brain]
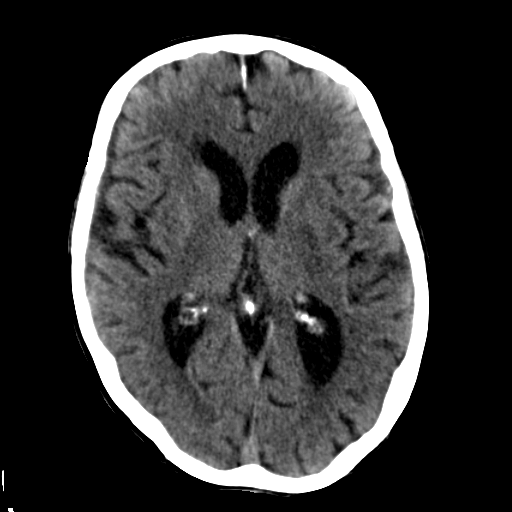
[im 16/32  brain]
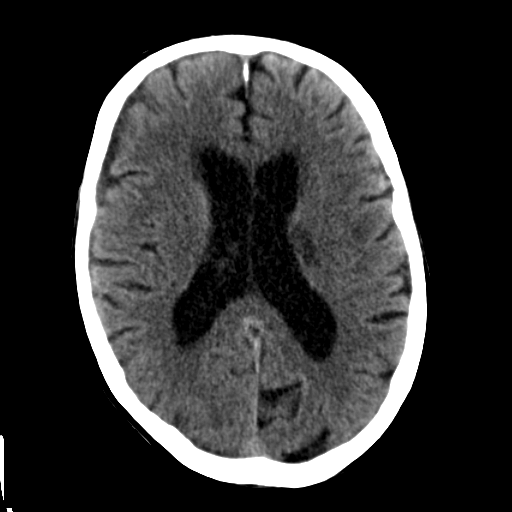
[im 18/32  brain]
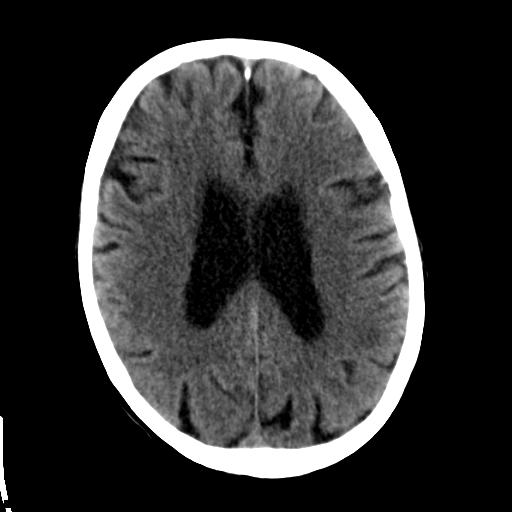
[im 20/32  brain]
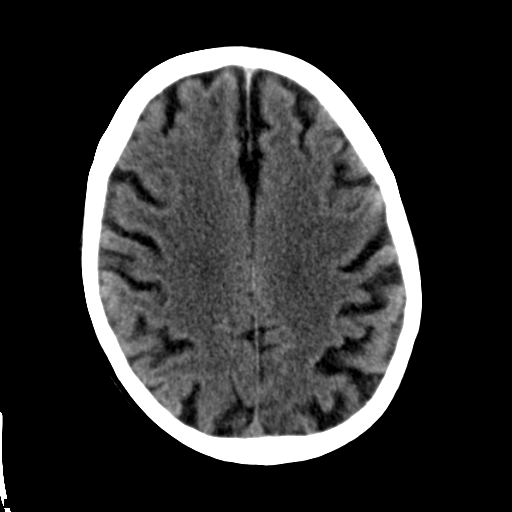
[im 20/32  bone]
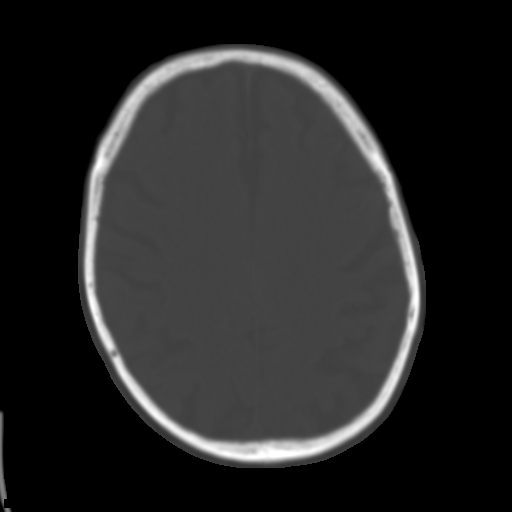
[im 23/32  brain]
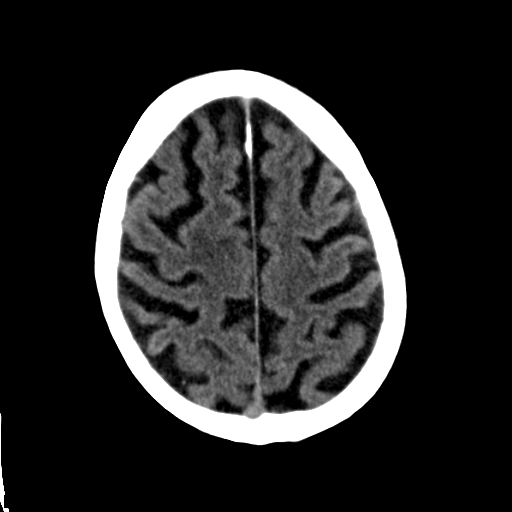
[im 25/32  brain]
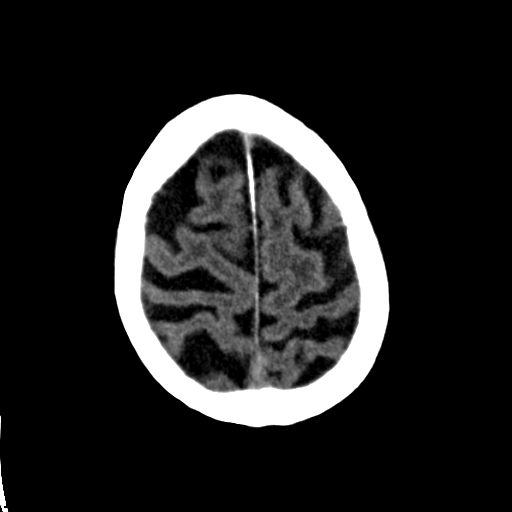
[im 27/32  brain]
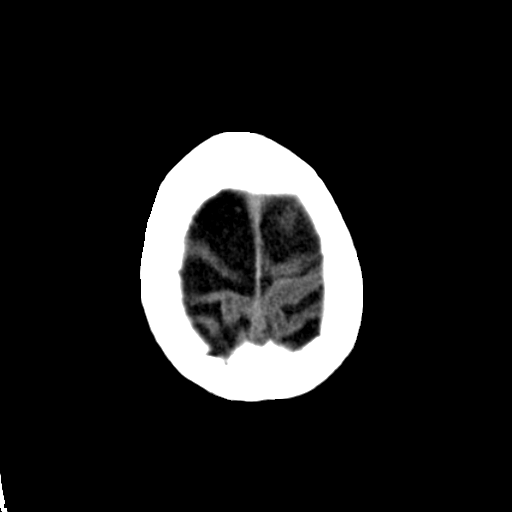
[im 29/32  brain]
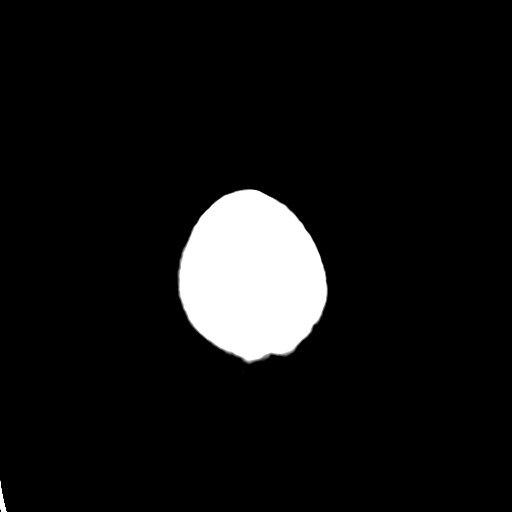
[im 29/32  bone]
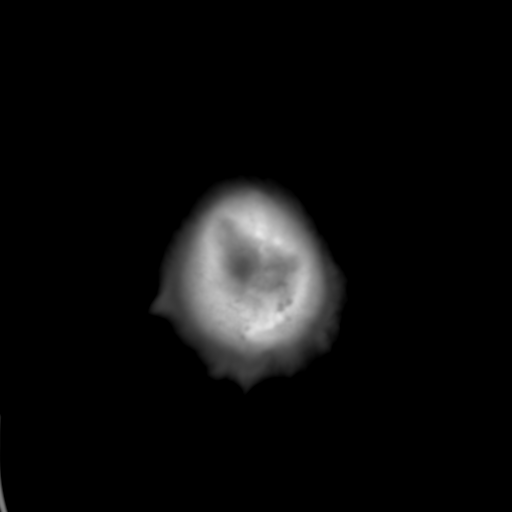

[13 of 30 positions shown; findings below may reference images not displayed]

PROCEDURE:     CT  - CT HEAD WITHOUT CONTRAST  - August 31, 2013  [DATE]

RESULT:     Emergent noncontrast CT is compared to the previous exam dated
08/28/2013. There is again prominence of the ventricles and sulci. There is
low-attenuation diffusely in the periventricular and subcortical white
matter most consistent with chronic microvascular ischemic disease. There is
some low-attenuation adjacent to the body of the left lateral ventricle on
image 17 which was not definitely demonstrated previously. This may
represent an evolving infarct. There is no intracranial hemorrhage, mass,
mass effect or other evolving infarct. The paranasal sinuses and mastoid air
cells show normal appearing aeration. No depressed skull fracture is evident.
IMPRESSION: 1. Changes of atrophy. Chronic microvascular ischemic disease is present.
There appears to be an evolving infarct adjacent to the body of the left
lateral ventricle best appreciated on image 17 and measuring approximately
1.9 cm anterior to posterior and 8 mm transversely on image 17. No
associated hemorrhage, mass effect or midline shift.

[REDACTED]

## 2015-07-09 IMAGING — CR DG CHEST 1V
1 series · 1 of 1 positions shown · non-contrast
Comparison: none

REASON FOR EXAM: Hypoxia, leucocytosis. Aspiration?
COMMENTS:

[ap]
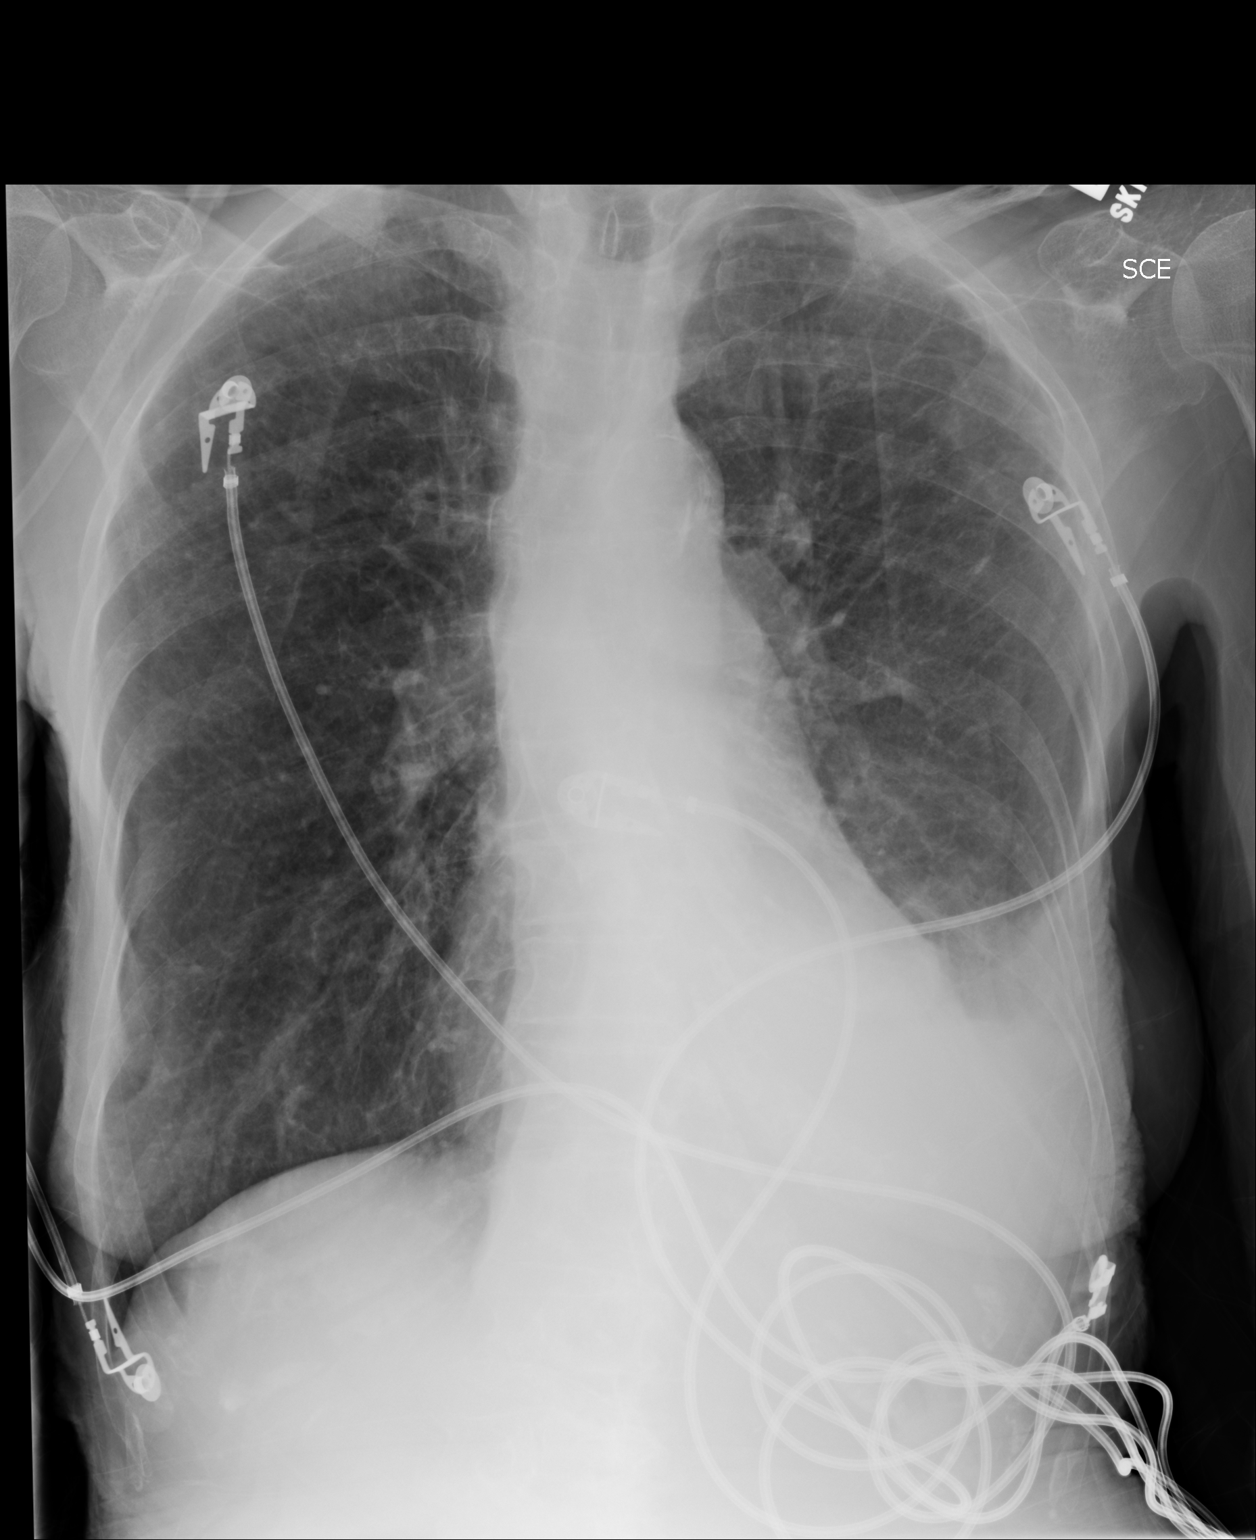

[1 of 1 positions shown; findings below may reference images not displayed]

PROCEDURE:     DXR - DXR CHEST 1 VIEWAP OR PA  - September 01, 2013  [DATE]

RESULT:     Comparison is made to the previous radiographs of the chest
dated 08/31/2013 and 08/28/2013 there is a persistent left pleural effusion
has decreased in size compared to 915. The lungs are hyperinflated
consistent with COPD. The heart is nonenlarged. Atherosclerotic
calcification is present. Interstitial fibrosis and possibly some edema
remain present.
IMPRESSION: COPD with fibrosis. Atherosclerotic disease present.
Persistent left pleural effusion that is slightly smaller than 915.

[REDACTED]
